# Patient Record
Sex: Female | Born: 1965 | Race: White | Hispanic: No | Smoking: Former smoker
Health system: Southern US, Community
[De-identification: ages and names within clinical notes are randomized; demographics above are authoritative.]

## PROBLEM LIST (undated history)

## (undated) DIAGNOSIS — F17200 Nicotine dependence, unspecified, uncomplicated: Secondary | ICD-10-CM

## (undated) DIAGNOSIS — M1990 Unspecified osteoarthritis, unspecified site: Secondary | ICD-10-CM

## (undated) DIAGNOSIS — I712 Thoracic aortic aneurysm, without rupture, unspecified: Secondary | ICD-10-CM

## (undated) DIAGNOSIS — F419 Anxiety disorder, unspecified: Secondary | ICD-10-CM

## (undated) DIAGNOSIS — E039 Hypothyroidism, unspecified: Secondary | ICD-10-CM

## (undated) DIAGNOSIS — R911 Solitary pulmonary nodule: Secondary | ICD-10-CM

## (undated) HISTORY — PX: CHOLECYSTECTOMY: SHX55

## (undated) HISTORY — PX: RADICAL HYSTERECTOMY: SHX2283

## (undated) HISTORY — PX: HERNIA REPAIR: SHX51

## (undated) HISTORY — DX: Anxiety disorder, unspecified: F41.9

## (undated) HISTORY — DX: Hypothyroidism, unspecified: E03.9

## (undated) HISTORY — DX: Solitary pulmonary nodule: R91.1

## (undated) HISTORY — DX: Nicotine dependence, unspecified, uncomplicated: F17.200

## (undated) HISTORY — DX: Thoracic aortic aneurysm, without rupture: I71.2

## (undated) HISTORY — DX: Unspecified osteoarthritis, unspecified site: M19.90

## (undated) HISTORY — PX: OOPHORECTOMY: SHX86

## (undated) HISTORY — DX: Thoracic aortic aneurysm, without rupture, unspecified: I71.20

---

## 2007-07-11 ENCOUNTER — Emergency Department (HOSPITAL_COMMUNITY): Admission: EM | Admit: 2007-07-11 | Discharge: 2007-07-11 | Payer: Self-pay | Admitting: Emergency Medicine

## 2008-04-24 ENCOUNTER — Inpatient Hospital Stay (HOSPITAL_COMMUNITY): Admission: EM | Admit: 2008-04-24 | Discharge: 2008-04-26 | Payer: Self-pay | Admitting: Emergency Medicine

## 2008-04-25 ENCOUNTER — Encounter: Payer: Self-pay | Admitting: Infectious Disease

## 2008-12-14 IMAGING — CR DG FINGER MIDDLE 2+V*R*
3 series · 3 of 3 positions shown · non-contrast
Comparison: none

CLINICAL DATA: Laceration right middle finger.  Closed finger in cash register.  Pain distal aspect of right middle finger.
 RIGHT MIDDLE FINGER ? 3 VIEW:

[x finger pa right]
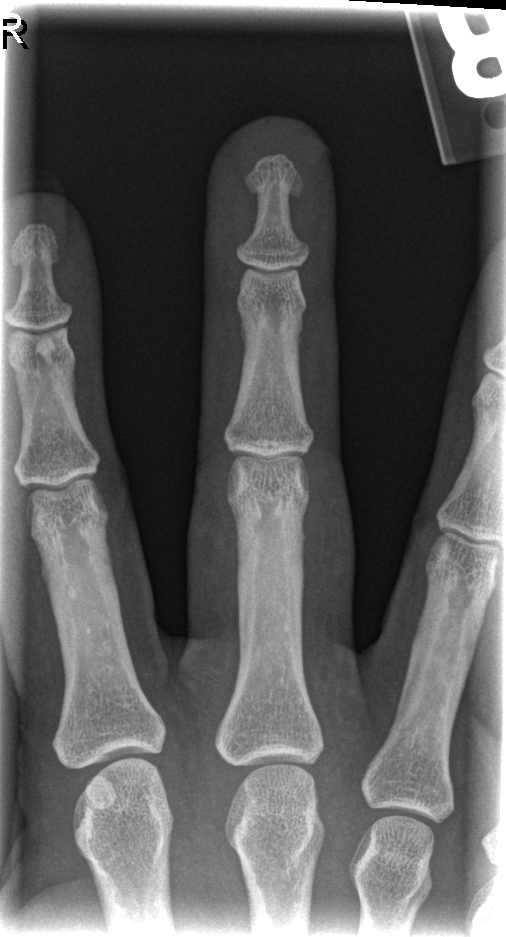

[x finger obl. right]
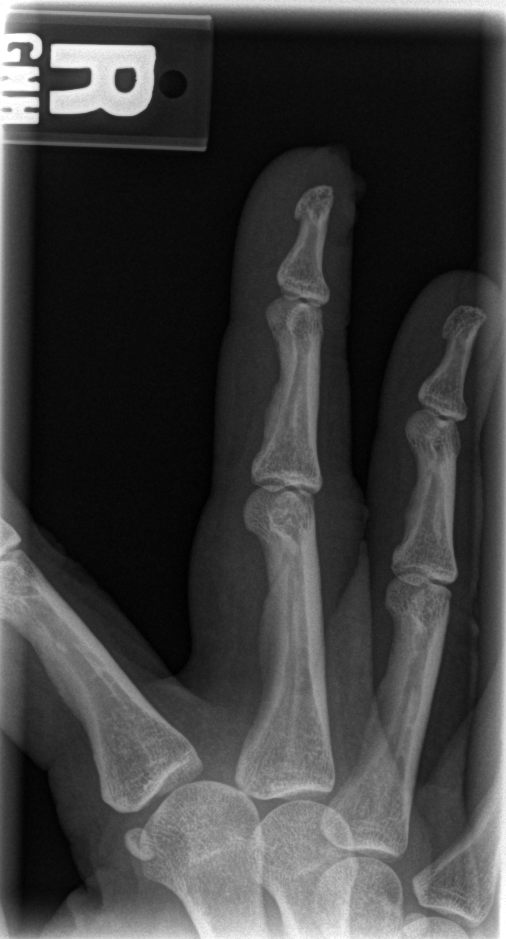

[x finger lateral right]
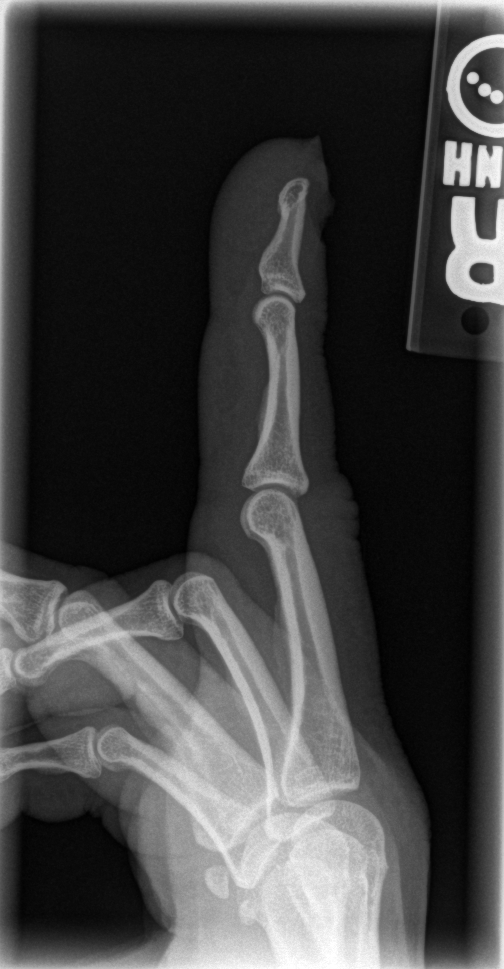

[3 of 3 positions shown; findings below may reference images not displayed]

FINDINGS: No fracture or bony displacement.  There appears to be injury to the nail region.
IMPRESSION: No fracture.

## 2010-10-02 NOTE — H&P (Signed)
NAMEJAIYAH, Shea NO.:  [id number]   MEDICAL RECORD NO.:  [id number]          PATIENT TYPE:  EMS   LOCATION:  MAJO                         FACILITY:  [facility]   PHYSICIAN:  Acey Lav, MD  DATE OF BIRTH:  Jul 20, 1965   DATE OF ADMISSION:  04/24/2008  DATE OF DISCHARGE:                              HISTORY & PHYSICAL   The patient has an unassigned primary care physician.   CHIEF COMPLAINT:  Chest tightness with syncope.   HISTORY OF PRESENT ILLNESS:  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  She developed chest tightness bit without any associated  symptoms of diaphoresis, nausea, or vomiting. It was substernal, and she  rated it a 5/10 in severity.  It persisted for several hours, and then  at around noontime she sat down at a table because of the severity of  the chest pain, and as she sat down passed out.  She only remembers  having the chest tightness increasing severity shortly before passing  out.  She did not have any prior diaphoresis or nausea.  Apparently  after her syncopal episode she endorsed to colleagues chest tightness  and pain radiating down her left arm along with nausea, and she vomited  several times with food.  She does not recall that.  She still had some  chest tightness thereafter, but by the time she came in via EMS to the  emergency department,  she was no longer having chest tightness.  She  was given nitroglycerin en route which gave her a slightly worsened  headache.  She was also given nitro paste in the emergency department  after she was already chest pain free.  Currently she complains of a  severe headache and when she found out that she was on nitroglycerin  paste removed it.  She does not have a family history that is  significant for any coronary artery  disease.  Her lipid status is  unknown.   PAST MEDICAL HISTORY:  She has had three C-section.  She had her  gallbladder removed.   FAMILY HISTORY:  No early coronary disease.   SOCIAL HISTORY:  The patient is divorced.  She smokes a half-pack of  cigarettes a day, occasionally uses alcohol.  Denies other recreational  drug use.   MEDICATIONS:  None.   ALLERGIES:  The patient has had intense erythema, pruritus, and facial  swelling and lip swelling with VANCOMYCIN.  She has restless legs with  PHENERGAN.   PHYSICAL EXAMINATION:  VITAL SIGNS:  Blood pressure lying was 123/79  with a pulse of 85, respirations 20, temperature 98.2. Then an hour  later sitting blood pressure 131/92 with a pulse of 90, and then 119/77  with a pulse of 80. Shortly after standing blood pressure was 114/94  with a pulse of 110.  She did not have symptoms.  She is satting 100% on  room air.  GENERAL: Pleasant lady, but is dysphoric,  holding her head because of  headache.  HEENT: Normocephalic and atraumatic.  Pupils are equal, round, and  reactive to light.  Sclerae are anicteric. Oropharynx is clear without  exudative lesions.  NECK: Supple.  CARDIOVASCULAR: Revealed regular rate and rhythm without murmurs,  gallops or rubs.  LUNGS: Clear to auscultation bilaterally without wheezes, rhonchi, or  rales.  ABDOMEN:  Soft, nondistended and nontender. No hepatosplenomegaly.  EXTREMITIES: Without edema.  SKIN: No rashes.   LABORATORY DATA:  EKG showed normal sinus rhythm, low voltage in AVF, T-  wave flattening in III and AVF but otherwise no acute ST or T-wave  elevations or inversions.  There are no prior EKGs for comparison.   LABORATORY DATA:  PTT 26, PT 112.8, INR 1.  Cardiac markers negative.  Chemistry iSTAT hemoglobin of 14.6, hematocrit 43.  Sodium 141,  potassium 4.2, chloride 104, glucose 102, BUN 11, creatinine 0.8.   ASSESSMENT AND PLAN:  1. Chest tightness:  Unclear what the cause of  this was. It may or may      not have responded to nitroglycerin.  She has never had any prior      episodes.  She is currently chest painfree with one set of negative      cardiac markers. We are going to cycle her cardiac enzymes x2. I      have low suspicion for pulmonary embolism and nothing to make me      concerned for pneumonia.  Of note her chest x-ray showed no      infiltrates or acute cardiopulmonary problems.  Will place her on      an aspirin and cycle her cardiac enzymes and watch her on telemetry      overnight.  2. Syncope:  Unclear what the cause of this was. Her pulse had      increased with standing, but she does not have symptoms on      orthostatic blood pressure changes to suggest this as a cause. Will      recheck this.  Her history sounds more consistent with a syncope      due to pain, or other vasovagal stimulus given  her having vomited      shortly afterwards.  Will watch her on telemetry tonight.  Will      cycle her cardiac enzymes.  I will order a 2-D echo in the morning.      Will consider consulting cardiology for further workup. The patient      if she has any recurrence of symptoms would benefit from an event      monitor.  3. Smoking.  The patient is counseled to stop smoking.  4. Prophylaxis.  The patient is on heparin 5000 t.i.d.  5. Code status.  The patient is full code.      Acey Lav, MD  Electronically Signed     CV/MEDQ  D:  04/24/2008  T:  04/24/2008  Job:  [id number]

## 2010-10-02 NOTE — Discharge Summary (Signed)
NAMEMarland Kitchen  Lisa, Shea NO.:  [id number]   MEDICAL RECORD NO.:  [id number]          PATIENT TYPE:  INP   LOCATION:  2040                         FACILITY:  [facility]   PHYSICIAN:  Richarda Overlie, MD       DATE OF BIRTH:  [birth year]   DATE OF ADMISSION:  04/24/2008  DATE OF DISCHARGE:  04/26/2008                               DISCHARGE SUMMARY   DISCHARGE DIAGNOSES:  1. Chest pain, rule out acute coronary syndrome.  2. Syncope, likely secondary to vasovagal episode.   SUBJECTIVE:  This is a 45 year old Caucasian female with a past medical  history of smoking who developed chest tightness at work on the 6th.  Chest pain was described as substernal in location and persisted for  several hours.  Prior to the syncopal episode the patient also had  several episodes of vomiting.  On presentation to the ER the patient was  chest pain free.  The patient was found to have positive orthostatics in  terms of increase in her pulse rate on standing, but she was  asymptomatic during this time.  Her EKG showed normal sinus rhythm, low  voltage  in aVF, T-wave flattening in 3 and aVF.  These EKG changes  remained unchanged on serial EKGs.  Telemetry remained negative.  The  patient's  cardiac enzymes remained negative.  She was not found to have  any gross electrolyte abnormalities.  Chest tightness/ pain  was found  to be secondary to GI symptoms and there was a low suspicion for PE and  pneumonia.  Chest x-ray was also found to be negative and her syncopal  episode was thought to be due to a vasovagal stimulus, given that it was  shortly after her episode of vomiting.  Serial cardiac enzymes remained  negative.  The patient was found to have mildly elevated triglycerides  at 155, her LDL cholesterol was found to be 60.  Her thyroid function  was found to be normal.  Cardiac echo showed an ejection fraction of 65%  without any obvious regional wall motion abnormalities, no obvious  valvular heart disease.  The patient was ambulated to make sure that she  was steady on her feet.  She was found to have normal vital signs prior  to her discharge.  The patient is being set up with an event monitor  prior to discharge for about 30 days with [facility] .  She has  refused assistance with smoking cessation and is not interested at this  time despite intensive counseling prior to her discharge.   The patient states that she would like to follow up with [facility] [place].  She has been advised to set up this  appointment in 1-2 weeks.   FOLLOWUP CONCERNS:  Follow up with [facility]  [facility] in 1-2 weeks.  Consult for [facility] to set up event monitor has been  requested      Richarda Overlie, MD  Electronically Signed     NA/MEDQ  D:  04/26/2008  T:  04/26/2008  Job:  [id number]

## 2011-02-22 LAB — LIPID PANEL
Cholesterol: 117 mg/dL (ref 0–200)
LDL Cholesterol: 60 mg/dL (ref 0–99)
Total CHOL/HDL Ratio: 4.5 RATIO
Triglycerides: 155 mg/dL — ABNORMAL HIGH (ref <150)

## 2011-02-22 LAB — POCT I-STAT, CHEM 8
Calcium, Ion: 1.18 mmol/L (ref 1.12–1.32)
Chloride: 104 mEq/L (ref 96–112)
HCT: 43 % (ref 36.0–46.0)
Potassium: 4.2 mEq/L (ref 3.5–5.1)
Sodium: 141 mEq/L (ref 135–145)

## 2011-02-22 LAB — TROPONIN I
Troponin I: <0.01 ng/mL (ref 0.00–0.06)
Troponin I: <0.01 ng/mL (ref 0.00–0.06)

## 2011-02-22 LAB — POCT CARDIAC MARKERS
Myoglobin, poc: 41.5 ng/mL (ref 12–200)
Troponin i, poc: <0.05 ng/mL (ref 0.00–0.09)

## 2011-02-22 LAB — PROTIME-INR: INR: 1 (ref 0.00–1.49)

## 2011-02-22 LAB — APTT: aPTT: 26 seconds (ref 24–37)

## 2011-02-22 LAB — CK TOTAL AND CKMB (NOT AT ARMC)
Relative Index: INVALID (ref 0.0–2.5)
Total CK: 51 U/L (ref 7–177)

## 2016-08-23 ENCOUNTER — Ambulatory Visit (INDEPENDENT_AMBULATORY_CARE_PROVIDER_SITE_OTHER): Payer: Worker's Compensation | Admitting: Family

## 2016-08-23 ENCOUNTER — Ambulatory Visit (INDEPENDENT_AMBULATORY_CARE_PROVIDER_SITE_OTHER): Payer: Worker's Compensation

## 2016-08-23 DIAGNOSIS — M545 Low back pain: Secondary | ICD-10-CM | POA: Diagnosis not present

## 2016-08-23 DIAGNOSIS — M25562 Pain in left knee: Secondary | ICD-10-CM | POA: Diagnosis not present

## 2016-08-23 MED ORDER — METHYLPREDNISOLONE ACETATE 40 MG/ML IJ SUSP
40.0000 mg | INTRAMUSCULAR | Status: AC | PRN
  Administered 2016-08-23: 40 mg via INTRA_ARTICULAR

## 2016-08-23 MED ORDER — LIDOCAINE HCL 1 % IJ SOLN
5.0000 mL | INTRAMUSCULAR | Status: AC | PRN
  Administered 2016-08-23: 5 mL

## 2016-08-23 NOTE — Progress Notes (Signed)
Office Visit Note   Patient: Lisa Shea           Date of Birth: 04-Jun-1965           MRN: [id number] Visit Date: 08/23/2016              Requested by: No referring provider defined for this encounter. PCP: No PCP Per Patient  No chief complaint on file.     HPI: Patient is a 51 year old woman who presents today for evaluation of left lower extremity pain as well as low back pain. She is complaining of pain from her back all the way down to her foot. This is a workman's comp case as she was injured at work. 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 She states the left lower extremity ended at the knee and leg folded up underneath her out abducting at the hip. Initial injury on August 15, 2016.  She was initially evaluated at [facility] where radiographs of her "lower extremity on the left" were negative for fracture. Since [facility], then referred to [place].  She is complaining of worst pain in her low back this is bilateral is worse on the left. States that her left lower extremity feels heavy and weak.denies numbness or tingling or burning pain down left lower extremity. And left medial knee pain. This is painful with range of motion of the knee pain with weightbearing.  Complains of knee swelling. Pain in back and lower extremity with ambulation. Complains of worsening of swelling and pain over the course of the day whether she is sitting or standing.  Has trialed Vicodin. has been taking prednisone 20 mg 3 times daily without relief. Has also tried meloxicam without relief  The patient states that she has "no meniscus in the left knee" has reportedly been seen in our office previously and had a scope with one of our physicians. A review of SRS using her current and maiden name is unrevealing for any documentation thereof.  Maiden name "odendaal"  His return to work for seated  and light duty however her knee continues to be a problem she feels she is unable to continue working on her feet.    Assessment & Plan: Visit Diagnoses:  1. Acute bilateral low back pain, with sciatica presence unspecified   2. Acute pain of left knee     Plan: Depo-Medrol injection left knee today. We'll proceed with MRI of the lumbar spine. Continue with ice for the left knee. Recommended that she continue to take nonsteroidals for the knee and back pain. May use with the meloxicam or ibuprofen or Aleve. Did provide a work note for seated light duty work no bending no squatting no lifting greater than 5 #s.  Follow-Up Instructions: No Follow-up on file.   Left Knee Exam   Tenderness  The patient is experiencing tenderness in the medial joint line (global).  Range of Motion  Left knee extension: pain with extension.   Tests  Drawer:       Anterior - negative      Varus: negative Valgus: negative  Other  Swelling: mild Effusion: no effusion present   Back Exam   Tenderness  The patient is experiencing tenderness in the lumbar (Views, soft tissue and spinous process).  Range of Motion  The patient has normal back ROM.  Tests  Straight leg raise right:  positive Straight leg raise left: positive  Comments:  Poor Effort with strength testing on the left.      Patient is alert, oriented, no adenopathy, well-dressed, normal affect, normal respiratory effort. In no acute distress.  Imaging: No results found.  Labs: No results found for: HGBA1C, ESRSEDRATE, CRP, LABURIC, REPTSTATUS, GRAMSTAIN, CULT, LABORGA  Orders:  Orders Placed This Encounter  Procedures  . Large Joint Injection/Arthrocentesis  . XR Lumbar Spine 2-3 Views   No orders of the defined types were placed in this encounter.    Procedures: Large Joint Inj Date/Time: 08/23/2016 10:21 AM Performed by: Adonis Huguenin Authorized by: Barnie Del R   Consent Given by:  Patient Site marked: the  procedure site was marked   Timeout: prior to procedure the correct patient, procedure, and site was verified   Indications:  Pain and diagnostic evaluation Location:  Knee Site:  L knee Needle Size:  22 G Needle Length:  1.5 inches Ultrasound Guidance: No   Fluoroscopic Guidance: No   Arthrogram: No   Medications:  5 mL lidocaine 1 %; 40 mg methylPREDNISolone acetate 40 MG/ML Aspiration Attempted: No   Patient tolerance:  Patient tolerated the procedure well with no immediate complications    Clinical Data: No additional findings.  ROS:  Review of Systems  Constitutional: Negative for chills and fever.  Cardiovascular: Negative for leg swelling.  Musculoskeletal: Positive for arthralgias, back pain, gait problem, joint swelling and myalgias.  Neurological: Positive for weakness. Negative for numbness.    Objective: Vital Signs: There were no vitals taken for this visit.  Specialty Comments:  No specialty comments available.  PMFS History: There are no active problems to display for this patient.  No past medical history on file.  No family history on file.  No past surgical history on file. Social History   Occupational History  . Not on file.   Social History Main Topics  . Smoking status: Not on file  . Smokeless tobacco: Not on file  . Alcohol use Not on file  . Drug use: Unknown  . Sexual activity: Not on file

## 2016-09-09 ENCOUNTER — Telehealth (INDEPENDENT_AMBULATORY_CARE_PROVIDER_SITE_OTHER): Payer: Self-pay | Admitting: Family

## 2016-09-09 DIAGNOSIS — M47816 Spondylosis without myelopathy or radiculopathy, lumbar region: Secondary | ICD-10-CM

## 2016-09-09 NOTE — Telephone Encounter (Signed)
Patient called asking for pain medication. CB # [phone]

## 2016-09-10 NOTE — Telephone Encounter (Signed)
I called and this pt had her MRI last week with murphy wainer. We do not have a copy of the radiology report. Can you see if you can pull this up? I advised the pt that Dr. Lajoyce Corners will be in this afternoon. He can review and advise if he is able to provide pain medication and what the plan will be. She had this study a week ago. Thanks!

## 2016-09-10 NOTE — Telephone Encounter (Signed)
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 This may be the source of her back pain. Would recommend a follow-up appointment with Dr. Alvester Morin to see if she could benefit from possible steroid injection.

## 2016-09-10 NOTE — Telephone Encounter (Signed)
Printed off for Dr. Lajoyce Corners to review when he is in the office this afternoon.

## 2016-09-11 ENCOUNTER — Telehealth (INDEPENDENT_AMBULATORY_CARE_PROVIDER_SITE_OTHER): Payer: Self-pay | Admitting: Orthopedic Surgery

## 2016-09-11 MED ORDER — TRAMADOL HCL 50 MG PO TABS: 50.0000 mg | ORAL_TABLET | Freq: Four times a day (QID) | ORAL | 0 refills | Status: DC | PRN

## 2016-09-11 NOTE — Addendum Note (Signed)
Addended by: Donalee Citrin on: 09/11/2016 02:01 PM   Modules accepted: Orders

## 2016-09-11 NOTE — Telephone Encounter (Signed)
OV need to confirm with work comp

## 2016-09-11 NOTE — Telephone Encounter (Signed)
Patient was called. Order was placed for possible epidural injection of lower back. Patient advised of message below. Sent in tramadol for her pain per Denny Peon.

## 2016-09-11 NOTE — Telephone Encounter (Signed)
Pending wc auth. Pt is aware. See referral.

## 2016-09-11 NOTE — Telephone Encounter (Signed)
I called and spoke with patient to advised of previous message from Dr. Lajoyce Corners about her MRI. Wanting to schedule possible injection for lower back, severe arthritis L4-5, L5-S1. Advised they will most likely do a consultation visit before hand. She is wanting to know exactly where the shot will be given. We have called her in tramadol for her pain.

## 2016-09-11 NOTE — Telephone Encounter (Signed)
Please advise. Also sent inbox message from referral to you.

## 2016-09-13 ENCOUNTER — Telehealth (INDEPENDENT_AMBULATORY_CARE_PROVIDER_SITE_OTHER): Payer: Self-pay | Admitting: Radiology

## 2016-09-13 NOTE — Telephone Encounter (Signed)
Patient called and stated she wanted Tramadol sent to [facility] in [place]. The chart had [facility]. I called in Tramadol to [facility] at [address]. I called [facility] and spoke with Tammy and had her delete RX there.

## 2016-09-19 ENCOUNTER — Telehealth (INDEPENDENT_AMBULATORY_CARE_PROVIDER_SITE_OTHER): Payer: Self-pay

## 2016-09-19 ENCOUNTER — Ambulatory Visit (INDEPENDENT_AMBULATORY_CARE_PROVIDER_SITE_OTHER): Payer: Worker's Compensation | Admitting: Physical Medicine and Rehabilitation

## 2016-09-19 ENCOUNTER — Encounter (INDEPENDENT_AMBULATORY_CARE_PROVIDER_SITE_OTHER): Payer: Self-pay | Admitting: Physical Medicine and Rehabilitation

## 2016-09-19 ENCOUNTER — Telehealth (INDEPENDENT_AMBULATORY_CARE_PROVIDER_SITE_OTHER): Payer: Self-pay | Admitting: Physical Medicine and Rehabilitation

## 2016-09-19 VITALS — BP 135/107 | HR 81

## 2016-09-19 DIAGNOSIS — M5442 Lumbago with sciatica, left side: Secondary | ICD-10-CM

## 2016-09-19 DIAGNOSIS — M47816 Spondylosis without myelopathy or radiculopathy, lumbar region: Secondary | ICD-10-CM

## 2016-09-19 MED ORDER — BACLOFEN 10 MG PO TABS: 10.0000 mg | ORAL_TABLET | Freq: Three times a day (TID) | ORAL | 0 refills | Status: DC | PRN

## 2016-09-19 NOTE — Telephone Encounter (Signed)
Lisa Shea (wc adj)  todays office note and request for approval of injection

## 2016-09-19 NOTE — Progress Notes (Deleted)
Pain across lower back. Radiates into left leg on March 29. Larey SeatFell and landed with left leg out to side. No numbness or tingling. Was recently prescribed Tramadol, but reports that it doesn't help much with the pain.

## 2016-09-19 NOTE — Telephone Encounter (Signed)
Needs WC auth for facet injection

## 2016-09-19 NOTE — Progress Notes (Signed)
Lisa Shea - 51 y.o. female MRN [id number]  Date of birth: 02-11-1966  Office Visit Note: Visit Date: 09/19/2016 PCP: No PCP Per Patient Referred by: No ref. provider found  Subjective: Chief Complaint  Patient presents with  . Lower Back - Pain   HPI: Lisa Shea is a very pleasant 51 year old female who was evaluated by  Barnie Del, FN-P and Dr. Lajoyce Corners for work-related injury where she was coming out of a walk-in freezer on a loading dock when she fell on 08/15/2016. She states the left lower extremity bent the knee and leg folded up underneath her with abducting at the hip. Since that time she has had severe low back pain with radiating symptoms into the posterior lateral left leg to about the knee. She denies any numbness tingling or paresthesia. 80 some symptoms into the calf but not the foot. No focal weakness but the leg feels weak at times. She has no right-sided leg complaints. No prior history of radicular pain. We are seeing her today for evaluation of possible interventional spine procedure. We are not seeing her for evaluation and management of her total condition. She did have an MRI of the lumbar spine which is reviewed below. She states that her pain is constant at this point however if you ask her she will say that standing is much worse than sitting or lying. She gets some pain with rotation. She reports that she does take tramadol without much relief at all. She is asking about other pain medications. She says she doesn't really like to take pills in general. She reports that she is well spell to get back to work but she can't do anything at this point with the amount of pain that she's having.    Review of Systems  Constitutional: Negative for chills, fever, malaise/fatigue and weight loss.  HENT: Negative for hearing loss and sinus pain.   Eyes: Negative for blurred vision, double vision and photophobia.  Respiratory: Negative for cough and shortness of breath.     Cardiovascular: Negative for chest pain, palpitations and leg swelling.  Gastrointestinal: Negative for abdominal pain, nausea and vomiting.  Genitourinary: Negative for flank pain.  Musculoskeletal: Positive for back pain and joint pain. Negative for myalgias.  Skin: Negative for itching and rash.  Neurological: Negative for tremors, focal weakness and weakness.  Endo/Heme/Allergies: Negative.   Psychiatric/Behavioral: Negative for depression.  All other systems reviewed and are negative.  Otherwise per HPI.  Assessment & Plan: Visit Diagnoses: No diagnosis found.  Plan: Findings:  Chronic worsening axial low back pain with referral in the left hip and thigh which I think is more of a facet joint syndrome than true radicular pain. Reviewing the MRI does not show any focal disc herniations or focal nerve compression. She has pretty significant facet arthropathy at L4-5 and then left more than right severe arthropathy at L5-S1. There is a small retrolisthesis of L3 on 4. There is a grade 1 listhesis anteriorly of L4 on L5. I think given those findings and her exam it would be beneficial for diagnostic and hopefully therapeutic L4-5 and L5-S1 facet joint blocks on the left side. If she gets a great deal of relief from that it would be diagnostic and I think this would go on and try to help her get back to where she can now work. Obviously there's and evaluated her from an orthopedic standpoint to Dr. Audrie Lia office. In terms of her looking at pain medications this is not  a situation for chronic opioid management. I think the tramadol she could use even 2 tablets and that may help more. I'm going to prescribe baclofen as a muscle relaxer which also may help some of the spasming that she has into the leg. Obviously the arthritic changes a chronic problem that is exacerbated by her fall at this point. We will try to get approval for a right L4-5 and L5-S1 facet joint block diagnostically and  therapeutically. I spent more than 25 minutes speaking face-to-face with the patient with 50% of the time in counseling.    Meds & Orders:  Meds ordered this encounter  Medications  . DISCONTD: baclofen (LIORESAL) 10 MG tablet    Sig: Take 1 tablet (10 mg total) by mouth every 8 (eight) hours as needed for muscle spasms (Pain).    Dispense:  60 tablet    Refill:  0  . baclofen (LIORESAL) 10 MG tablet    Sig: Take 1 tablet (10 mg total) by mouth every 8 (eight) hours as needed for muscle spasms (Pain).    Dispense:  60 tablet    Refill:  0   No orders of the defined types were placed in this encounter.   Follow-up: Return for Right L4-5 and L5-S1 facet joint block.   Procedures: No procedures performed  No notes on file   Clinical History: MRI LUMBAR SPINE WITHOUT CONTRAST  TECHNIQUE: Multiplanar, multisequence MR imaging of the lumbar spine was performed. No intravenous contrast was administered.  COMPARISON: Lumbar spine radiograph 08/23/2016  FINDINGS: Segmentation: Standard.  Alignment: Grade 1 L3-L4 retrolisthesis.  Vertebrae: Hemangioma in the T12 vertebral body. No other focal marrow lesion. No discitis osteomyelitis or epidural collection. No compression fracture.  Conus medullaris: Extends to the L1 level and appears normal.  Paraspinal and other soft tissues: The visualized aorta, IVC and iliac vessels are normal. The visualized retroperitoneal organs and paraspinal soft tissues are normal.  Disc levels:  T12-L1: Normal disc space and facets. No spinal canal or neuroforaminal stenosis.  L1-L2: Normal disc space and facets. No spinal canal or neuroforaminal stenosis.  L2-L3: Disc desiccation with minimal bulge. No stenosis.  L3-L4: Disc desiccation and small bulge. No stenosis.  L4-L5: Severe bilateral facet hypertrophy resulting grade 1 anterolisthesis. No stenosis.  L5-S1: Moderate right and severe left facet hypertrophy. Mild right neural  foraminal narrowing.  Visualized sacrum: Normal.  IMPRESSION: 1. No spinal canal or neural foraminal stenosis. 2. Severe L4-L5 and L5-S1 facet arthrosis, which may be a source of low back pain. 3. No finding to explain the reported left lower extremity radiculopathy.   Electronically Signed By: Deatra RobinsonKevin Herman M.D. On: 08/31/2016 02:47  She reports that she has been smoking Cigarettes.  She has never used smokeless tobacco. No results for input(s): HGBA1C, LABURIC in the last 8760 hours.  Objective:  VS:  HT:    WT:   BMI:     BP:(!) 135/107  HR:81bpm  TEMP: ( )  RESP:  Physical Exam  Constitutional: She is oriented to person, place, and time. She appears well-developed and well-nourished. No distress.  HENT:  Head: Normocephalic and atraumatic.  Nose: Nose normal.  Mouth/Throat: Oropharynx is clear and moist.  Eyes: Conjunctivae are normal. Pupils are equal, round, and reactive to light.  Neck: Normal range of motion. Neck supple.  Cardiovascular: Regular rhythm and intact distal pulses.   Pulmonary/Chest: Effort normal. No respiratory distress. She has no wheezes.  Abdominal: She exhibits no distension.  Musculoskeletal:  Patient  ambulates with an antalgic gait to the left. She has pain with extension rotation to the left lumbar spine. She has no pain over the greater trochanters but is somewhat tender over the left greater trochanter. She has good distal strength although she has to be motivated to give full effort. She has no clonus bilaterally. She has a negative slump test bilaterally.  Neurological: She is alert and oriented to person, place, and time. She exhibits normal muscle tone. Coordination normal.  Skin: Skin is warm. No rash noted. No erythema.  Psychiatric: She has a normal mood and affect. Her behavior is normal.  Nursing note and vitals reviewed.   Ortho Exam Imaging: No results found.  Past Medical/Family/Surgical/Social History: Medications & Allergies  reviewed per EMR There are no active problems to display for this patient.  History reviewed. No pertinent past medical history. History reviewed. No pertinent family history. History reviewed. No pertinent surgical history. Social History   Occupational History  . Not on file.   Social History Main Topics  . Smoking status: Current Every Day Smoker    Types: Cigarettes  . Smokeless tobacco: Never Used  . Alcohol use Not on file  . Drug use: Unknown  . Sexual activity: Not on file

## 2016-09-20 ENCOUNTER — Ambulatory Visit (INDEPENDENT_AMBULATORY_CARE_PROVIDER_SITE_OTHER): Payer: Self-pay | Admitting: Family

## 2016-09-20 NOTE — Telephone Encounter (Signed)
[  External Email]FW: [EXTERNAL] Re: Gracie **secure**  DM  . Press the Enter key to open the contact card."[email or web address]>  Reply all   Today, 9:08 AM  Sharrie Self     Peer.pdf  2 MB    Download  Save to [employer] -   *Caution - External Email* Facet block is authorized  Lyman Spellereborah Martinez MEDICAL ONLY CLAIMS REPRESENTATIVE [facility] [employer] [email or web address] [phone], [phone]

## 2016-09-20 NOTE — Telephone Encounter (Signed)
Patient is scheduled for 5/7 at 230 with a driver.

## 2016-09-20 NOTE — Telephone Encounter (Signed)
Received auth below. LVM for pt to call back to discuss.

## 2016-09-20 NOTE — Telephone Encounter (Signed)
Approval received and patient scheduled.

## 2016-09-23 ENCOUNTER — Ambulatory Visit (INDEPENDENT_AMBULATORY_CARE_PROVIDER_SITE_OTHER): Payer: Worker's Compensation

## 2016-09-23 ENCOUNTER — Encounter (INDEPENDENT_AMBULATORY_CARE_PROVIDER_SITE_OTHER): Payer: Self-pay | Admitting: Physical Medicine and Rehabilitation

## 2016-09-23 ENCOUNTER — Ambulatory Visit (INDEPENDENT_AMBULATORY_CARE_PROVIDER_SITE_OTHER): Payer: Worker's Compensation | Admitting: Physical Medicine and Rehabilitation

## 2016-09-23 VITALS — BP 148/108 | HR 78

## 2016-09-23 DIAGNOSIS — M47816 Spondylosis without myelopathy or radiculopathy, lumbar region: Secondary | ICD-10-CM

## 2016-09-23 MED ORDER — LIDOCAINE HCL (PF) 1 % IJ SOLN
2.0000 mL | Freq: Once | INTRAMUSCULAR | Status: AC
  Administered 2016-09-24: 2 mL

## 2016-09-23 MED ORDER — METHYLPREDNISOLONE ACETATE 80 MG/ML IJ SUSP
80.0000 mg | Freq: Once | INTRAMUSCULAR | Status: AC
  Administered 2016-09-24: 80 mg

## 2016-09-23 MED ORDER — IIOPAMIDOL (ISOVUE-250) INJECTION 51%
3.0000 mL | Freq: Once | INTRAVENOUS | Status: AC
  Administered 2016-09-24: 3 mL
  Filled 2016-09-23: qty 50

## 2016-09-23 NOTE — Progress Notes (Signed)
Lisa LanesKelly Gainer - 51 y.o. female MRN [id number]  Date of birth: 03-25-1966  Office Visit Note: Visit Date: 09/23/2016 PCP: Patient, No Pcp Per Referred by: No ref. provider found  Subjective: Chief Complaint  Patient presents with  . Lower Back - Pain   HPI: Mrs. Mccambridge is a 51 year old female who I saw for recent evaluation for potential interventional procedure for her low back pain. She comes in today with again left more than right but bilateral low back pain. Pain referring into the hip and leg. MRI showing significant facet arthropathy at L4-5 and left much worse than right severe facet arthropathy at L5-S1. She is followed by Dr. Lajoyce Cornersuda for her worker's compensation injury. She is here for planned left L4-5 and L5-S1 facet joint block. This would be diagnostic and hopefully therapeutic. She is somewhat anxious today about the injection.    ROS Otherwise per HPI.  Assessment & Plan: Visit Diagnoses:  1. Spondylosis without myelopathy or radiculopathy, lumbar region     Plan: Findings:  Left L5-S1 facet joint block with fluoroscopic guidance. The patient had really uncommon pain even with numbing the skin and needling gently through the musculature. We decided because of the pain response to stop at L5-S1 but we did get good flow of contrast into the articular joint that point. I think in the future depending on if she gets any relief with this injection and she needs another one potentially that I would give her preprocedure Valium or requested this be done at the surgical Center with light sedation. Again the pain response was not even from the spine it was basically from the musculature area. The patient says she has a high pain tolerance but obviously compared to most of the injections we do she had sort of an exaggerated response with the musculature. Otherwise there were no problems with the injection and no anatomical difficulties.    Meds & Orders:  Meds ordered this encounter    Medications  . lidocaine (PF) (XYLOCAINE) 1 % injection 2 mL  . iopamidol (ISOVUE-250) 51 % injection 3 mL  . methylPREDNISolone acetate (DEPO-MEDROL) injection 80 mg    Orders Placed This Encounter  Procedures  . Facet Injection  . XR C-ARM NO REPORT    Follow-up: Return for Dr. Lajoyce Cornersuda.   Procedures: No procedures performed  Lumbar Facet Joint Intra-Articular Injection(s) with Fluoroscopic Guidance  Patient: Lisa Shea      Date of Birth: 03-25-1966 MRN: [id number] PCP: Patient, No Pcp Per      Visit Date: 09/23/2016   Universal Protocol:    Date/Time: 05/08/185:53 AM  Consent Given By: the patient  Position: PRONE   Additional Comments: Vital signs were monitored before and after the procedure. Patient was prepped and draped in the usual sterile fashion. The correct patient, procedure, and site was verified.   Injection Procedure Details:  Procedure Site One Meds Administered:  Meds ordered this encounter  Medications  . lidocaine (PF) (XYLOCAINE) 1 % injection 2 mL  . iopamidol (ISOVUE-250) 51 % injection 3 mL  . methylPREDNISolone acetate (DEPO-MEDROL) injection 80 mg     Laterality: Left  Location/Site:  L5-S1  Needle size: 22 guage  Needle type: Spinal  Needle Placement: Articular  Findings:  -Contrast Used: 1 mL iohexol 180 mg iodine/mL   -Comments: Excellent flow of contrast producing a partial arthrogram.  Procedure Details: The fluoroscope beam is vertically oriented in AP, and the inferior recess is visualized beneath the lower pole of the  inferior apophyseal process, which represents the target point for needle insertion. When direct visualization is difficult the target point is located at the medial projection of the vertebral pedicle. The region overlying each aforementioned target is locally anesthetized with a 1 to 2 ml. volume of 1% Lidocaine without Epinephrine.   The spinal needle was inserted into each of the above mentioned facet  joints using biplanar fluoroscopic guidance. A 0.25 to 0.5 ml. volume of Isovue-250 was injected and a partial facet joint arthrogram was obtained. A single spot film was obtained of the resulting arthrogram.    One to 1.25 ml of the steroid/anesthetic solution was then injected into each of the facet joints noted above.   Additional Comments:  No complications occurred.  The patient did not really tolerate the 27-gauge needle to numb the skin as well as the 22-gauge needle directed through the musculature to the joint. She had a very exaggerated uncommon pain response to that despite going slowly and talking her through it and using copious lidocaine. We were able to finish the injection at L5-S1 but decided to abort the injection at L4-5 just because of the pain response. I think some of this is anxiety driven. She would likely do well with preprocedure I am or possibly sedation lightly at the surgery center. Dressing: Band-Aid    Post-procedure details: Patient was observed during the procedure. Post-procedure instructions were reviewed.  Patient left the clinic in stable condition.     Clinical History: MRI LUMBAR SPINE WITHOUT CONTRAST  TECHNIQUE: Multiplanar, multisequence MR imaging of the lumbar spine was performed. No intravenous contrast was administered.  COMPARISON: Lumbar spine radiograph 08/23/2016  FINDINGS: Segmentation: Standard.  Alignment: Grade 1 L3-L4 retrolisthesis.  Vertebrae: Hemangioma in the T12 vertebral body. No other focal marrow lesion. No discitis osteomyelitis or epidural collection. No compression fracture.  Conus medullaris: Extends to the L1 level and appears normal.  Paraspinal and other soft tissues: The visualized aorta, IVC and iliac vessels are normal. The visualized retroperitoneal organs and paraspinal soft tissues are normal.  Disc levels:  T12-L1: Normal disc space and facets. No spinal canal or neuroforaminal stenosis.  L1-L2:  Normal disc space and facets. No spinal canal or neuroforaminal stenosis.  L2-L3: Disc desiccation with minimal bulge. No stenosis.  L3-L4: Disc desiccation and small bulge. No stenosis.  L4-L5: Severe bilateral facet hypertrophy resulting grade 1 anterolisthesis. No stenosis.  L5-S1: Moderate right and severe left facet hypertrophy. Mild right neural foraminal narrowing.  Visualized sacrum: Normal.  IMPRESSION: 1. No spinal canal or neural foraminal stenosis. 2. Severe L4-L5 and L5-S1 facet arthrosis, which may be a source of low back pain. 3. No finding to explain the reported left lower extremity radiculopathy.   Electronically Signed By: Deatra Robinson M.D. On: 08/31/2016 02:47  She reports that she has been smoking Cigarettes.  She has never used smokeless tobacco. No results for input(s): HGBA1C, LABURIC in the last 8760 hours.  Objective:  VS:  HT:    WT:   BMI:     BP:(!) 148/108  HR:78bpm  TEMP: ( )  RESP:99 % Physical Exam  Musculoskeletal:  Patient ambulates without aid with good distal strength.    Ortho Exam Imaging: Xr C-arm No Report  Result Date: 09/23/2016 Please see Notes or Procedures tab for imaging impression.   Past Medical/Family/Surgical/Social History: Medications & Allergies reviewed per EMR There are no active problems to display for this patient.  History reviewed. No pertinent past medical history. History reviewed.  No pertinent family history. History reviewed. No pertinent surgical history. Social History   Occupational History  . Not on file.   Social History Main Topics  . Smoking status: Current Every Day Smoker    Types: Cigarettes  . Smokeless tobacco: Never Used  . Alcohol use Not on file  . Drug use: Unknown  . Sexual activity: Not on file

## 2016-09-23 NOTE — Patient Instructions (Signed)

## 2016-09-23 NOTE — Progress Notes (Deleted)
Patient is here today for planned L4-5 and L5-S1 facet injection. No change in symptoms.

## 2016-09-24 DIAGNOSIS — M47816 Spondylosis without myelopathy or radiculopathy, lumbar region: Secondary | ICD-10-CM | POA: Diagnosis not present

## 2016-09-24 NOTE — Procedures (Signed)
Lumbar Facet Joint Intra-Articular Injection(s) with Fluoroscopic Guidance  Patient: Lisa Shea      Date of Birth: 12/14/1965 MRN: [id number] PCP: Patient, No Pcp Per      Visit Date: 09/23/2016   Universal Protocol:    Date/Time: 05/08/185:53 AM  Consent Given By: the patient  Position: PRONE   Additional Comments: Vital signs were monitored before and after the procedure. Patient was prepped and draped in the usual sterile fashion. The correct patient, procedure, and site was verified.   Injection Procedure Details:  Procedure Site One Meds Administered:  Meds ordered this encounter  Medications  . lidocaine (PF) (XYLOCAINE) 1 % injection 2 mL  . iopamidol (ISOVUE-250) 51 % injection 3 mL  . methylPREDNISolone acetate (DEPO-MEDROL) injection 80 mg     Laterality: Left  Location/Site:  L5-S1  Needle size: 22 guage  Needle type: Spinal  Needle Placement: Articular  Findings:  -Contrast Used: 1 mL iohexol 180 mg iodine/mL   -Comments: Excellent flow of contrast producing a partial arthrogram.  Procedure Details: The fluoroscope beam is vertically oriented in AP, and the inferior recess is visualized beneath the lower pole of the inferior apophyseal process, which represents the target point for needle insertion. When direct visualization is difficult the target point is located at the medial projection of the vertebral pedicle. The region overlying each aforementioned target is locally anesthetized with a 1 to 2 ml. volume of 1% Lidocaine without Epinephrine.   The spinal needle was inserted into each of the above mentioned facet joints using biplanar fluoroscopic guidance. A 0.25 to 0.5 ml. volume of Isovue-250 was injected and a partial facet joint arthrogram was obtained. A single spot film was obtained of the resulting arthrogram.    One to 1.25 ml of the steroid/anesthetic solution was then injected into each of the facet joints noted above.   Additional  Comments:  No complications occurred.  The patient did not really tolerate the 27-gauge needle to numb the skin as well as the 22-gauge needle directed through the musculature to the joint. She had a very exaggerated uncommon pain response to that despite going slowly and talking her through it and using copious lidocaine. We were able to finish the injection at L5-S1 but decided to abort the injection at L4-5 just because of the pain response. I think some of this is anxiety driven. She would likely do well with preprocedure I am or possibly sedation lightly at the surgery center. Dressing: Band-Aid    Post-procedure details: Patient was observed during the procedure. Post-procedure instructions were reviewed.  Patient left the clinic in stable condition.

## 2016-09-25 ENCOUNTER — Ambulatory Visit (INDEPENDENT_AMBULATORY_CARE_PROVIDER_SITE_OTHER): Payer: Self-pay | Admitting: Family

## 2016-10-11 ENCOUNTER — Telehealth (INDEPENDENT_AMBULATORY_CARE_PROVIDER_SITE_OTHER): Payer: Self-pay

## 2016-10-11 NOTE — Telephone Encounter (Signed)
Received vm asking when pts next appt is. No appt scheduled. IC pt and s/w her boyfriend and left message for her to call the office to make a return appt. IC Debbie back and left vm advising.

## 2016-10-21 ENCOUNTER — Ambulatory Visit (INDEPENDENT_AMBULATORY_CARE_PROVIDER_SITE_OTHER): Payer: Self-pay | Admitting: Family

## 2017-04-30 ENCOUNTER — Emergency Department (HOSPITAL_COMMUNITY): Payer: Self-pay

## 2017-04-30 ENCOUNTER — Other Ambulatory Visit: Payer: Self-pay

## 2017-04-30 ENCOUNTER — Emergency Department (HOSPITAL_COMMUNITY)
Admission: EM | Admit: 2017-04-30 | Discharge: 2017-04-30 | Disposition: A | Discharge status: Home / Self Care | Payer: Self-pay | Attending: Emergency Medicine | Admitting: Emergency Medicine

## 2017-04-30 DIAGNOSIS — R0789 Other chest pain: Secondary | ICD-10-CM | POA: Insufficient documentation

## 2017-04-30 DIAGNOSIS — F17210 Nicotine dependence, cigarettes, uncomplicated: Secondary | ICD-10-CM | POA: Insufficient documentation

## 2017-04-30 DIAGNOSIS — Z79899 Other long term (current) drug therapy: Secondary | ICD-10-CM | POA: Insufficient documentation

## 2017-04-30 LAB — CBC WITH DIFFERENTIAL/PLATELET
BASOS ABS: 0 10*3/uL (ref 0.0–0.1)
BASOS PCT: 0 %
Eosinophils Absolute: 0.4 10*3/uL (ref 0.0–0.7)
Eosinophils Relative: 6 %
HEMATOCRIT: 40.1 % (ref 36.0–46.0)
HEMOGLOBIN: 13 g/dL (ref 12.0–15.0)
Lymphocytes Relative: 35 %
Lymphs Abs: 2.3 10*3/uL (ref 0.7–4.0)
MCH: 27.5 pg (ref 26.0–34.0)
MCHC: 32.4 g/dL (ref 30.0–36.0)
MCV: 84.8 fL (ref 78.0–100.0)
Monocytes Absolute: 0.4 10*3/uL (ref 0.1–1.0)
Monocytes Relative: 6 %
NEUTROS ABS: 3.5 10*3/uL (ref 1.7–7.7)
NEUTROS PCT: 53 %
Platelets: 205 10*3/uL (ref 150–400)
RBC: 4.73 MIL/uL (ref 3.87–5.11)
RDW: 13.7 % (ref 11.5–15.5)
WBC: 6.7 10*3/uL (ref 4.0–10.5)

## 2017-04-30 LAB — COMPREHENSIVE METABOLIC PANEL
ALBUMIN: 3.8 g/dL (ref 3.5–5.0)
ALK PHOS: 96 U/L (ref 38–126)
ALT: 17 U/L (ref 14–54)
AST: 18 U/L (ref 15–41)
Anion gap: 7 (ref 5–15)
BILIRUBIN TOTAL: 0.5 mg/dL (ref 0.3–1.2)
BUN: 9 mg/dL (ref 6–20)
CO2: 26 mmol/L (ref 22–32)
Calcium: 8.9 mg/dL (ref 8.9–10.3)
Chloride: 107 mmol/L (ref 101–111)
Creatinine, Ser: 0.7 mg/dL (ref 0.44–1.00)
GFR calc Af Amer: >60 mL/min (ref >60)
GFR calc non Af Amer: >60 mL/min (ref >60)
GLUCOSE: 83 mg/dL (ref 65–99)
POTASSIUM: 3.6 mmol/L (ref 3.5–5.1)
Sodium: 140 mmol/L (ref 135–145)
TOTAL PROTEIN: 6.4 g/dL — AB (ref 6.5–8.1)

## 2017-04-30 LAB — I-STAT TROPONIN, ED
TROPONIN I, POC: 0 ng/mL (ref 0.00–0.08)
Troponin i, poc: 0 ng/mL (ref 0.00–0.08)

## 2017-04-30 LAB — D-DIMER, QUANTITATIVE: D-Dimer, Quant: 0.5 ug/mL-FEU (ref 0.00–0.50)

## 2017-04-30 MED ORDER — RANITIDINE HCL 150 MG PO TABS: 150.0000 mg | ORAL_TABLET | Freq: Two times a day (BID) | ORAL | 0 refills | Status: DC

## 2017-04-30 MED ORDER — GI COCKTAIL ~~LOC~~
30.0000 mL | Freq: Once | ORAL | Status: AC
  Administered 2017-04-30: 30 mL via ORAL
  Filled 2017-04-30: qty 30

## 2017-04-30 NOTE — Discharge Instructions (Signed)
Follow-up with a family doctor in the next 2 weeks to get a recheck of your blood pressure and recheck of your symptoms

## 2017-04-30 NOTE — ED Triage Notes (Signed)
Pt arrives EMS from work where she c/o burning chest sensation with chest pressure and SHOB. Nausea with vomiting x 1. Hx of MI 2010. 324 Aspiriin and zofan 4 mg PTA. EMS staes right bp 150/104 left bp 162/110

## 2017-04-30 NOTE — ED Provider Notes (Signed)
[facility] Provider Note   CSN: [id number] Arrival date & time: 04/30/17  1051     History   Chief Complaint Chief Complaint  Patient presents with  . Chest Pain    HPI Lisa Shea is a 51 y.o. female.  Patient complains of chest pain today kind of a burning pain   The history is provided by the patient.  Chest Pain   This is a new problem. The current episode started 6 to 12 hours ago. The problem occurs constantly. The problem has not changed since onset.The pain is associated with rest. The pain is present in the epigastric region. The pain is at a severity of 4/10. The pain is moderate. The quality of the pain is described as burning. Pertinent negatives include no abdominal pain, no back pain, no cough and no headaches.  Pertinent negatives for past medical history include no seizures.    No past medical history on file.  There are no active problems to display for this patient.   No past surgical history on file.  OB History    No data available       Home Medications    Prior to Admission medications   Medication Sig Start Date End Date Taking? Authorizing Provider  baclofen (LIORESAL) 10 MG tablet Take 1 tablet (10 mg total) by mouth every 8 (eight) hours as needed for muscle spasms (Pain). 09/19/16   Tyrell AntonioNewton, Frederic, MD  HYDROcodone-acetaminophen (NORCO/VICODIN) 5-325 MG tablet TK 1 T PO Q 4 H 08/15/16   [provider]  meloxicam (MOBIC) 7.5 MG tablet TK 1 T PO BID 08/15/16   [provider]  predniSONE (DELTASONE) 20 MG tablet TK 3 TS PO DAILY FOR 5 DAYS 08/19/16   [provider]  ranitidine (ZANTAC) 150 MG tablet Take 1 tablet (150 mg total) by mouth 2 (two) times daily. 04/30/17   Bethann BerkshireZammit, Maedell Hedger, MD  traMADol (ULTRAM) 50 MG tablet Take 1 tablet (50 mg total) by mouth every 6 (six) hours as needed for moderate pain. 09/11/16   Adonis HugueninZamora, Erin R, NP    Family History No family history on  file.  Social History Social History   Tobacco Use  . Smoking status: Current Every Day Smoker    Types: Cigarettes  . Smokeless tobacco: Never Used  Substance Use Topics  . Alcohol use: Not on file  . Drug use: Not on file     Allergies   Patient has no allergy information on record.   Review of Systems Review of Systems  Constitutional: Negative for appetite change and fatigue.  HENT: Negative for congestion, ear discharge and sinus pressure.   Eyes: Negative for discharge.  Respiratory: Negative for cough.   Cardiovascular: Positive for chest pain.  Gastrointestinal: Negative for abdominal pain and diarrhea.  Genitourinary: Negative for frequency and hematuria.  Musculoskeletal: Negative for back pain.  Skin: Negative for rash.  Neurological: Negative for seizures and headaches.  Psychiatric/Behavioral: Negative for hallucinations.     Physical Exam Updated Vital Signs BP 136/90   Pulse 76   Resp 14   Ht 5\' 7"  (1.702 m)   Wt 82.8 kg (182 lb 9.6 oz)   SpO2 100%   BMI 28.60 kg/m   Physical Exam  Constitutional: She is oriented to person, place, and time. She appears well-developed.  HENT:  Head: Normocephalic.  Eyes: Conjunctivae and EOM are normal. No scleral icterus.  Neck: Neck supple. No thyromegaly present.  Cardiovascular: Normal  rate and regular rhythm. Exam reveals no gallop and no friction rub.  No murmur heard. Pulmonary/Chest: No stridor. She has no wheezes. She has no rales. She exhibits no tenderness.  Abdominal: She exhibits no distension. There is no tenderness. There is no rebound.  Musculoskeletal: Normal range of motion. She exhibits no edema.  Lymphadenopathy:    She has no cervical adenopathy.  Neurological: She is oriented to person, place, and time. She exhibits normal muscle tone. Coordination normal.  Skin: No rash noted. No erythema.  Psychiatric: She has a normal mood and affect. Her behavior is normal.     ED Treatments /  Results  Labs (all labs ordered are listed, but only abnormal results are displayed) Labs Reviewed  COMPREHENSIVE METABOLIC PANEL - Abnormal; Notable for the following components:      Result Value   Total Protein 6.4 (*)    All other components within normal limits  CBC WITH DIFFERENTIAL/PLATELET  D-DIMER, QUANTITATIVE (NOT [facility])  I-STAT TROPONIN, ED  I-STAT TROPONIN, ED    EKG  EKG Interpretation None       Radiology Dg Chest 2 View  Result Date: 04/30/2017 CLINICAL DATA:  Chest pain and pressure with nausea and vomiting EXAM: CHEST  2 VIEW COMPARISON:  July 12, 2015 FINDINGS: The lungs are clear. Heart size and pulmonary vascularity are within normal limits. No adenopathy. No pneumothorax. No bone lesions. IMPRESSION: No edema or consolidation. Electronically Signed   By: Bretta BangWilliam  Woodruff III M.D.   On: 04/30/2017 11:47    Procedures Procedures (including critical care time)  Medications Ordered in ED Medications  gi cocktail (Maalox,Lidocaine,Donnatal) (30 mLs Oral Given 04/30/17 1128)     Initial Impression / Assessment and Plan / ED Course  I have reviewed the triage vital signs and the nursing notes.  Pertinent labs & imaging results that were available during my care of the patient were reviewed by me and considered in my medical decision making (see chart for details). Patient improved with GI cocktail.  Troponin was negative x2.  Doubt this coronary artery disease.  Patient will be placed on Zantac and told to follow-up with your family doctor to check her blood pressure and her GERD symptoms      Final Clinical Impressions(s) / ED Diagnoses   Final diagnoses:  Atypical chest pain    ED Discharge Orders        Ordered    ranitidine (ZANTAC) 150 MG tablet  2 times daily     04/30/17 1544       Bethann BerkshireZammit, Ryle Buscemi, MD 04/30/17 1547

## 2018-01-13 DIAGNOSIS — I712 Thoracic aortic aneurysm, without rupture, unspecified: Secondary | ICD-10-CM | POA: Insufficient documentation

## 2018-01-14 ENCOUNTER — Encounter: Payer: Self-pay | Admitting: Surgery

## 2018-01-26 ENCOUNTER — Institutional Professional Consult (permissible substitution) (INDEPENDENT_AMBULATORY_CARE_PROVIDER_SITE_OTHER): Payer: Self-pay | Admitting: Cardiothoracic Surgery

## 2018-01-26 ENCOUNTER — Other Ambulatory Visit: Payer: Self-pay

## 2018-01-26 ENCOUNTER — Encounter: Payer: Self-pay | Admitting: Cardiothoracic Surgery

## 2018-01-26 VITALS — BP 137/99 | HR 87 | Resp 16 | Ht 67.0 in | Wt 177.0 lb

## 2018-01-26 DIAGNOSIS — I712 Thoracic aortic aneurysm, without rupture, unspecified: Secondary | ICD-10-CM

## 2018-01-26 DIAGNOSIS — I10 Essential (primary) hypertension: Secondary | ICD-10-CM

## 2018-01-26 NOTE — Progress Notes (Signed)
PCP is Lisa Shea, No Pcp Per Referring Provider is Sistasis, Lowell Guitar, MD  Chief Complaint  Lisa Shea presents with  . 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  The aorta was noted to be 4.2 cm without hematoma, ulceration, or edema.  The arch and descending thoracic aorta appear to be of normal diameter.  2009 the Lisa Shea had an episode of syncope and had an echocardiogram-the aortic valve was noted to be tricuspid.  There is been no previous CT scans of the chest to compare.  There is no family history of aortic dissection, aortic valve disease, heart surgery, or abdominal aneurysm disease.  The Lisa Shea smoked half a pack per day but in the past 2 months has decreased her smoking to 1 to 2 cigarettes daily.  Lisa Shea takes her blood pressure regularly at home and she states her blood pressure commonly is 150-165 systolic.  She denies substernal or upper back pain or shortness of breath.  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   Past Medical History:  Diagnosis Date  . Aneurysm of thoracic aorta (HCC)   . Anxiety   . Arthritis   . Hypothyroidism   . Lung nodule   . Tobacco dependence     Past Surgical History:  Procedure Laterality Date  . CESAREAN SECTION     x 3  . CHOLECYSTECTOMY    . HERNIA REPAIR    . OOPHORECTOMY     x 1  . RADICAL HYSTERECTOMY      Family History  Problem Relation Age of Onset  . Breast cancer Mother   . Hypertension Mother   . Diabetes Father     Social History Social History   Tobacco Use   . Smoking status: Former Smoker    Packs/day: 1.00    Years: 8.00    Pack years: 8.00    Types: Cigarettes    Start date: 12/26/2009    Last attempt to quit: 12/26/2017    Years since quitting: 0.0  . Smokeless tobacco: Never Used  Substance Use Topics  . Alcohol use: Yes  . Drug use: Never    No current outpatient medications on file.   No current facility-administered medications for this visit.     Allergies  Allergen Reactions  . Promethazine Hives  . Vancomycin Other (See Comments)    Edema  . Codeine Rash    Review of Systems  Slight weight gain over the past 2 weeks-?  Anxiety Left shoulder tenderness and discomfort extending into the left upper arm rotator cuff area No active dental complaints or difficulty swallowing No shortness of breath Mild ankle edema in the summertime No change in bowel habits No syncope change in vision or severe headache No night sweats or fever No productive cough or hemoptysis   BP (!) 137/99 (BP Location: Left Arm, Lisa Shea Position: Sitting, Cuff Size: Large)   Pulse 87   Resp 16   Ht 5\' 7"  (1.702 m)   Wt 177 lb (80.3 kg)   SpO2 97% Comment: ON RA  BMI 27.72  kg/m  Physical Exam      Exam    General- alert and comfortable but anxious, accompanied by husband    Neck- no JVD, no cervical adenopathy palpable, no carotid bruit.          Normal uvula morphology    Lungs- clear without rales, wheezes   Cor- regular rate and rhythm, no murmur , gallop   Abdomen- soft, non-tender   Extremities - warm, non-tender, minimal edema   Neuro- oriented, appropriate, no focal weakness   Diagnostic Tests: CT scan images personally reviewed and reviewed with Lisa Shea and husband.  Appearance  Of smooth fusiform ascending aneurysm measuring 4.2 cm above the sinotubular junction.  The arch and descending thoracic aorta.  Be normal.  2 mm small nodule right upper lung field appears low risk.  Impression: Small ascending thoracic  aneurysm Evidence of elevated blood pressure-hypertension History of s Kathlee Nations Trigt III, MD [facility] [phone] trying to quit Normal tricuspid configuration of the aortic valve on previous echo  Plan: Risk for aortic dissection at current dimension is very low less than 1%. Surgery not indicated until diameter approaches 5.5 cm. Best therapy is prevention with smoking cessation, blood pressure control and annual surveillance CT scans.  Lisa Shea will be started on low-dose losartan 25 mg daily.  She will stop smoking.  I will see her back one year from her previous CT scan to review her aortic pathology and blood pressure.  She will make an appointment with her primary care physician for review of her blood pressures which she will record 3 days a week.

## 2018-01-29 ENCOUNTER — Encounter: Payer: Self-pay | Admitting: Surgery

## 2018-01-29 ENCOUNTER — Encounter: Payer: Self-pay | Admitting: Cardiothoracic Surgery

## 2018-09-24 ENCOUNTER — Other Ambulatory Visit: Payer: Self-pay | Admitting: *Deleted

## 2018-09-24 DIAGNOSIS — I712 Thoracic aortic aneurysm, without rupture, unspecified: Secondary | ICD-10-CM

## 2018-12-23 ENCOUNTER — Other Ambulatory Visit: Payer: Self-pay

## 2018-12-23 ENCOUNTER — Ambulatory Visit: Payer: Self-pay | Admitting: Cardiothoracic Surgery
